# Patient Record
Sex: Male | Born: 1970 | Race: White | Hispanic: No | Marital: Single | Smoking: Current every day smoker
Health system: Southern US, Community
[De-identification: ages and names within clinical notes are randomized; demographics above are authoritative.]

---

## 2015-10-07 ENCOUNTER — Observation Stay (HOSPITAL_COMMUNITY)
Admission: EM | Admit: 2015-10-07 | Discharge: 2015-10-07 | Discharge status: Against Medical Advice | Payer: Self-pay | Attending: Internal Medicine | Admitting: Internal Medicine

## 2015-10-07 ENCOUNTER — Encounter (HOSPITAL_COMMUNITY): Payer: Self-pay | Admitting: Family Medicine

## 2015-10-07 ENCOUNTER — Emergency Department (HOSPITAL_COMMUNITY): Payer: Self-pay

## 2015-10-07 DIAGNOSIS — F17210 Nicotine dependence, cigarettes, uncomplicated: Secondary | ICD-10-CM | POA: Insufficient documentation

## 2015-10-07 DIAGNOSIS — Z7982 Long term (current) use of aspirin: Secondary | ICD-10-CM | POA: Insufficient documentation

## 2015-10-07 DIAGNOSIS — R079 Chest pain, unspecified: Secondary | ICD-10-CM | POA: Insufficient documentation

## 2015-10-07 DIAGNOSIS — R0789 Other chest pain: Secondary | ICD-10-CM

## 2015-10-07 DIAGNOSIS — F1020 Alcohol dependence, uncomplicated: Secondary | ICD-10-CM

## 2015-10-07 DIAGNOSIS — M25522 Pain in left elbow: Secondary | ICD-10-CM | POA: Insufficient documentation

## 2015-10-07 DIAGNOSIS — R55 Syncope and collapse: Principal | ICD-10-CM | POA: Insufficient documentation

## 2015-10-07 DIAGNOSIS — M25529 Pain in unspecified elbow: Secondary | ICD-10-CM

## 2015-10-07 DIAGNOSIS — Z8249 Family history of ischemic heart disease and other diseases of the circulatory system: Secondary | ICD-10-CM | POA: Insufficient documentation

## 2015-10-07 DIAGNOSIS — I10 Essential (primary) hypertension: Secondary | ICD-10-CM | POA: Insufficient documentation

## 2015-10-07 LAB — COMPREHENSIVE METABOLIC PANEL
ALK PHOS: 79 U/L (ref 38–126)
ALT: 44 U/L (ref 17–63)
AST: 29 U/L (ref 15–41)
Albumin: 3.9 g/dL (ref 3.5–5.0)
Anion gap: 11 (ref 5–15)
BILIRUBIN TOTAL: 0.6 mg/dL (ref 0.3–1.2)
BUN: 8 mg/dL (ref 6–20)
CALCIUM: 9.3 mg/dL (ref 8.9–10.3)
CO2: 26 mmol/L (ref 22–32)
CREATININE: 1.01 mg/dL (ref 0.61–1.24)
Chloride: 99 mmol/L — ABNORMAL LOW (ref 101–111)
GFR calc Af Amer: >60 mL/min (ref >60)
Glucose, Bld: 104 mg/dL — ABNORMAL HIGH (ref 65–99)
Potassium: 4.5 mmol/L (ref 3.5–5.1)
Sodium: 136 mmol/L (ref 135–145)
TOTAL PROTEIN: 6.9 g/dL (ref 6.5–8.1)

## 2015-10-07 LAB — I-STAT TROPONIN, ED: Troponin i, poc: 0 ng/mL (ref 0.00–0.08)

## 2015-10-07 LAB — CBC
HEMATOCRIT: 50.1 % (ref 39.0–52.0)
HEMOGLOBIN: 17.3 g/dL — AB (ref 13.0–17.0)
MCH: 32.1 pg (ref 26.0–34.0)
MCHC: 34.5 g/dL (ref 30.0–36.0)
MCV: 92.9 fL (ref 78.0–100.0)
Platelets: 150 10*3/uL (ref 150–400)
RBC: 5.39 MIL/uL (ref 4.22–5.81)
RDW: 14.1 % (ref 11.5–15.5)
WBC: 9.6 10*3/uL (ref 4.0–10.5)

## 2015-10-07 MED ORDER — ONDANSETRON HCL 4 MG/2ML IJ SOLN
4.0000 mg | Freq: Four times a day (QID) | INTRAMUSCULAR | Status: DC | PRN
Start: 2015-10-07 — End: 2015-10-07

## 2015-10-07 MED ORDER — ASPIRIN EC 81 MG PO TBEC: 81.0000 mg | DELAYED_RELEASE_TABLET | Freq: Every day | ORAL | Status: DC

## 2015-10-07 MED ORDER — ACETAMINOPHEN 325 MG PO TABS: 650.0000 mg | ORAL_TABLET | ORAL | Status: DC | PRN

## 2015-10-07 MED ORDER — ATORVASTATIN CALCIUM 80 MG PO TABS: 80.0000 mg | ORAL_TABLET | Freq: Every day | ORAL | Status: DC

## 2015-10-07 MED ORDER — SODIUM CHLORIDE 0.9 % IJ SOLN: 3.0000 mL | INTRAMUSCULAR | Status: DC | PRN

## 2015-10-07 MED ORDER — SODIUM CHLORIDE 0.9 % IV SOLN: 250.0000 mL | INTRAVENOUS | Status: DC | PRN

## 2015-10-07 MED ORDER — ASPIRIN 81 MG PO CHEW: 324.0000 mg | CHEWABLE_TABLET | Freq: Once | ORAL | Status: DC

## 2015-10-07 MED ORDER — NITROGLYCERIN 0.4 MG SL SUBL
0.4000 mg | SUBLINGUAL_TABLET | SUBLINGUAL | Status: DC | PRN
  Administered 2015-10-07 (×2): 0.4 mg via SUBLINGUAL
  Filled 2015-10-07: qty 1

## 2015-10-07 MED ORDER — VITAMIN B-1 100 MG PO TABS: 100.0000 mg | ORAL_TABLET | Freq: Every day | ORAL | Status: DC

## 2015-10-07 MED ORDER — SODIUM CHLORIDE 0.9 % IJ SOLN
3.0000 mL | Freq: Two times a day (BID) | INTRAMUSCULAR | Status: DC
Start: 2015-10-07 — End: 2015-10-07

## 2015-10-07 MED ORDER — HEPARIN SODIUM (PORCINE) 5000 UNIT/ML IJ SOLN: 5000.0000 [IU] | Freq: Three times a day (TID) | INTRAMUSCULAR | Status: DC

## 2015-10-07 MED ORDER — NITROGLYCERIN 0.4 MG SL SUBL: 0.4000 mg | SUBLINGUAL_TABLET | SUBLINGUAL | Status: DC | PRN

## 2015-10-07 MED ORDER — LORAZEPAM 2 MG/ML IJ SOLN: 1.0000 mg | Freq: Four times a day (QID) | INTRAMUSCULAR | Status: DC | PRN

## 2015-10-07 MED ORDER — ASPIRIN 81 MG PO CHEW
324.0000 mg | CHEWABLE_TABLET | Freq: Once | ORAL | Status: AC
  Administered 2015-10-07: 324 mg via ORAL
  Filled 2015-10-07: qty 4

## 2015-10-07 MED ORDER — ADULT MULTIVITAMIN W/MINERALS CH: 1.0000 | ORAL_TABLET | Freq: Every day | ORAL | Status: DC

## 2015-10-07 MED ORDER — FOLIC ACID 1 MG PO TABS: 1.0000 mg | ORAL_TABLET | Freq: Every day | ORAL | Status: DC

## 2015-10-07 MED ORDER — LORAZEPAM 1 MG PO TABS: 1.0000 mg | ORAL_TABLET | Freq: Four times a day (QID) | ORAL | Status: DC | PRN

## 2015-10-07 MED ORDER — THIAMINE HCL 100 MG/ML IJ SOLN: 100.0000 mg | Freq: Every day | INTRAMUSCULAR | Status: DC

## 2015-10-07 MED ORDER — METOPROLOL TARTRATE 25 MG PO TABS: 12.5000 mg | ORAL_TABLET | Freq: Two times a day (BID) | ORAL | Status: DC

## 2015-10-07 NOTE — ED Notes (Signed)
Pt here for intermittent chest pain since yesterday around 9 am. St he was washing dishes. sts cough, SOB and pain radiating into arm. sts EMS cam to the house last night and was advised not to come.

## 2015-10-07 NOTE — ED Notes (Signed)
Pt states "i refuse to stay". Paged admitting MD who spoke with nurse and  patient and discussed risks of leaving. Told to return for any reason. Pt agreeable to sign out AMA.

## 2015-10-07 NOTE — ED Notes (Signed)
Attempted report 

## 2015-10-07 NOTE — H&P (Signed)
Date: 10/07/2015               Patient Name:  Jeremy Mitchell MRN: [id number]  DOB: 09-Jun-1971 Age / Sex: 44 y.o., male   PCP: No Pcp Per Patient         Medical Service: Internal Medicine Teaching Service         Attending Physician: Dr. Burns Spain, MD    First Contact: Dr. Reubin Milan Pager: [phone]  Second Contact: Dr. Griffin Basil Pager: [phone]       After Hours (After 5p/  First Contact Pager: [phone]  weekends / holidays): Second Contact Pager: [phone]   Chief Complaint: Chest pressure and syncope  History of Present Illness: Mr. Hellums is a 44yo M with no significant PMH who presents with L-sided chest pressure that started yesterday and a syncopal episode last night. He noted that since yesterday morning he had L sided chest pressure, present at both rest and exertion, that was non-radiating, and not associated with any other symptoms. It was continuous throughout the day yesterday and yesterday afternoon while he was standing up, he passed out for <1 minute. He denied any other presyncopal symptoms, and did not have any jerking movements, tongue biting, loss of urinary of bowel continence while he was unconscious. He did not hit his head but fell on his L elbow, which is sore today. He had no post-syncope symptoms other than the continued chest pressure, after which he was given aspirin by his niece which helped the pressure. He woke up this morning and still had the same symptom which prompted him to come in to the ED. He denies diaphoresis, lightheadedness, blurred vision, chest pain, palpitations, fevers, myalgias, shortness of breath, nausea, vomiting, abdominal pain, changes in urination or bowel function, numbness or weakness. In the ED, nitro and ASA given which improved his symptoms.  He says he has been under a lot of financial and relationship stress recently, which has prompted him to smoke and drink more than usual, ~12-13 beers/day and ~2 packs/day.  He usually drinks about a 12 pack of beer/day and 1ppd smoker for 30 years.  PMH: Never seen a doctor, no significant PMH  PSH: Appendectomy  Meds: No home meds  Allergies: NKA  FH: Significant for CAD and HTN in most of his paternal relatives. Cousin got a pacemaker at 49. Brother had MI in 60s.   SH: See above  Meds: Current Facility-Administered Medications  Medication Dose Route Frequency Provider Last Rate Last Dose  . 0.9 %  sodium chloride infusion  250 mL Intravenous PRN Lora Paula, MD      . acetaminophen (TYLENOL) tablet 650 mg  650 mg Oral Q4H PRN Lora Paula, MD      . Melene Muller ON 10/08/2015] aspirin EC tablet 81 mg  81 mg Oral Daily Lora Paula, MD      . atorvastatin (LIPITOR) tablet 80 mg  80 mg Oral q1800 Lora Paula, MD      . folic acid (FOLVITE) tablet 1 mg  1 mg Oral Daily Lora Paula, MD      . heparin injection 5,000 Units  5,000 Units Subcutaneous 3 times per day Lora Paula, MD      . LORazepam (ATIVAN) tablet 1 mg  1 mg Oral Q6H PRN Lora Paula, MD       Or  . LORazepam (ATIVAN) injection 1 mg  1 mg Intravenous Q6H PRN Lora Paula, MD      .  metoprolol tartrate (LOPRESSOR) tablet 12.5 mg  12.5 mg Oral BID Lora PaulaJennifer T Krall, MD      . multivitamin with minerals tablet 1 tablet  1 tablet Oral Daily Lora PaulaJennifer T Krall, MD      . nitroGLYCERIN (NITROSTAT) SL tablet 0.4 mg  0.4 mg Sublingual Q5 Min x 3 PRN Lora PaulaJennifer T Krall, MD      . ondansetron [facility](ZOFRAN) injection 4 mg  4 mg Intravenous Q6H PRN Lora PaulaJennifer T Krall, MD      . sodium chloride 0.9 % injection 3 mL  3 mL Intravenous Q12H Lora PaulaJennifer T Krall, MD      . sodium chloride 0.9 % injection 3 mL  3 mL Intravenous PRN Lora PaulaJennifer T Krall, MD      . thiamine (VITAMIN B-1) tablet 100 mg  100 mg Oral Daily Lora PaulaJennifer T Krall, MD       Or  . thiamine (B-1) injection 100 mg  100 mg Intravenous Daily Lora PaulaJennifer T Krall, MD       No current outpatient prescriptions on file.     Allergies: Allergies as of 10/07/2015  . (No Known Allergies)   History reviewed. No pertinent past medical history. History reviewed. No pertinent past surgical history. History reviewed. No pertinent family history. Social History   Social History  . Marital Status: Single    Spouse Name: N/A  . Number of Children: N/A  . Years of Education: N/A   Occupational History  . Not on file.   Social History Main Topics  . Smoking status: Current Every Day Smoker -- 1.00 packs/day    Types: Cigarettes  . Smokeless tobacco: Not on file  . Alcohol Use: Yes     Comment: every day  . Drug Use: No  . Sexual Activity: Not on file   Other Topics Concern  . Not on file   Social History Narrative  . No narrative on file    Review of Systems: Pertinent items noted in HPI and remainder of comprehensive ROS otherwise negative.  Physical Exam: Blood pressure 145/96, pulse 78, temperature 98.5 F (36.9 C), resp. rate 17, weight 238 lb (107.956 kg), SpO2 97 %. Gen: Well-appearing, alert and oriented to person, place, and time HEENT: Oropharynx clear without erythema or exudate.  Neck: No cervical LAD, no thyromegaly or nodules, no JVD noted. CV: Normal rate, regular rhythm, no murmurs, rubs, or gallops Pulmonary: Normal effort, CTA bilaterally, no wheezing, rales, or rhonchi Abdominal: Soft, non-tender, non-distended, without rebound, guarding, or masses Extremities: Distal pulses 2+ in upper and lower extremities bilaterally, no tenderness, erythema or edema Skin: No atypical appearing moles. No rashes  Lab results: Basic Metabolic Panel:  Recent Labs  16/07/9611/10/16 1114  NA 136  K 4.5  CL 99*  CO2 26  GLUCOSE 104*  BUN 8  CREATININE 1.01  CALCIUM 9.3   Liver Function Tests:  Recent Labs  10/07/15 1114  AST 29  ALT 44  ALKPHOS 79  BILITOT 0.6  PROT 6.9  ALBUMIN 3.9   No results for input(s): LIPASE, AMYLASE in the last 72 hours. No results for input(s):  AMMONIA in the last 72 hours. CBC:  Recent Labs  10/07/15 1114  WBC 9.6  HGB 17.3*  HCT 50.1  MCV 92.9  PLT 150   Imaging results:  Dg Elbow Complete Left  10/07/2015  CLINICAL DATA:  Syncopal episode, fall, elbow pain EXAM: LEFT ELBOW - COMPLETE 3+ VIEW COMPARISON:  None. FINDINGS: There is no evidence of fracture,  dislocation, or joint effusion. There is no evidence of arthropathy or other focal bone abnormality. Soft tissues are unremarkable. IMPRESSION: No acute osseous finding. Electronically Signed   By: Judie Petit.  Shick M.D.   On: 10/07/2015 12:28   Ct Head Wo Contrast  10/07/2015  CLINICAL DATA:  Chest pain, syncopal episode EXAM: CT HEAD WITHOUT CONTRAST TECHNIQUE: Contiguous axial images were obtained from the base of the skull through the vertex without contrast. COMPARISON:  None FINDINGS: Normal appearance of the intracranial structures. No evidence for acute hemorrhage, mass lesion, midline shift, hydrocephalus or large infarct. No acute bony abnormality. Mastoids are clear. Scattered frontal, ethmoid, and sphenoid mucosal thickening appearing chronic. IMPRESSION: No acute intracranial abnormality. Electronically Signed   By: Judie Petit.  Shick M.D.   On: 10/07/2015 13:25   Dg Chest Port 1 View  10/07/2015  CLINICAL DATA:  Chest pain.  Chest pain started 9 o'clock yesterday. EXAM: PORTABLE CHEST 1 VIEW COMPARISON:  None. FINDINGS: Normal mediastinum and cardiac silhouette. Normal pulmonary vasculature. No evidence of effusion, infiltrate, or pneumothorax. No acute bony abnormality. IMPRESSION: Normal chest radiograph. Electronically Signed   By: Genevive Bi M.D.   On: 10/07/2015 11:44    Other results: EKG: NSR, T-wave inversions in V2-4, no prior EKG for comparison  Assessment & Plan by Problem: Active Problems:   Chest pain 1. Chest pressure with syncope - Likely cardiac vs stress/EtOH induced, given strong FH, L-sided pressure, heavy smoker/drinker. EKG shows TWI in V2-4.  Troponins negative. Will admit for ACS r/o. Explained necessity for patient to stay for further testing, as well as the potential medical risks of leaving but patient is adamant on leaving AMA.  -Echo -F/u CT head -Trend troponins -Telemetry -Counseled on smoking and EtOH cessation -Orthostatics -Start ASA 81, lipitor  QD, metoprolol 12.5 BID  2. HTN - previously not seen by a MD, elevated to 153/98 max today -Metoprolol as above  3. Elbow pain - likely contusion 2/2 syncopal event -Elbow Xray - negative for fracture  4. Alcohol use disorder  -CIWA protocol   Dispo: Disposition is deferred at this time, awaiting improvement of current medical problems. Anticipated discharge in approximately 1 day(s).   The patient does not have a current PCP (No Pcp Per Patient) and does need an [facility] hospital follow-up appointment after discharge.  The patient does not have transportation limitations that hinder transportation to clinic appointments.  Signed: Darrick Huntsman, MD 10/07/2015, 1:47 PM

## 2015-10-07 NOTE — ED Provider Notes (Signed)
CSN: [id number]     Arrival date & time 10/07/15  1019 History   First MD Initiated Contact with Patient 10/07/15 1035     Chief Complaint  Patient presents with  . Chest Pain  . Arm Pain     (Consider location/radiation/quality/duration/timing/severity/associated sxs/prior Treatment) HPI Comments: Patient is a 44 year old male with no significant past medical history presenting to the ED with chief complaint of chest pain and elbow pain. Patient reports having left-sided pressure-like chest pain since yesterday 9 AM. States the chest pain is worse when he coughs. States he was doing his dishes when the pain started (7/10 intensity) and since yesterday the pain has been intermittent, associated with slight SOB, and he is not sure how long each episode lasts. Denies having any fevers, chills, or lightheadedness. Patient reports being under a lot of stress after a recent separation. States he had similar CP several months ago. At present, states the chest pain is 2/10 in intensity.  He also reports having an episode of loss of consciousness last night. Patient's sister states he was standing and all of a sudden had a blank state, then fell face forward. States when the EMS came, patient was experiencing weakness in his legs and was confused. Patient states he woke up this morning with a headache (frontal) and took 4 Ibuprofens. He is also complaining of pain in his L elbow. Denies having any other pain. States he drinks 12-13 beers a day. Denies having any nausea, vomiting, abdominal pain, hematochezia or melena.   Patient is a 44 y.o. male presenting with chest pain and arm pain.  Chest Pain Associated symptoms: cough and shortness of breath   Associated symptoms: no abdominal pain, no back pain, no fever, no nausea, no numbness, not vomiting and no weakness   Arm Pain Associated symptoms include chest pain, congestion and coughing. Pertinent negatives include no abdominal pain, chills, fever,  nausea, neck pain, numbness, vomiting or weakness.    History reviewed. No pertinent past medical history. History reviewed. No pertinent past surgical history. History reviewed. No pertinent family history. Social History  Substance Use Topics  . Smoking status: Current Every Day Smoker -- 1.00 packs/day    Types: Cigarettes  . Smokeless tobacco: None  . Alcohol Use: Yes     Comment: every day    Review of Systems  Constitutional: Negative for fever and chills.  HENT: Positive for congestion.   Eyes: Negative for pain and discharge.  Respiratory: Positive for cough and shortness of breath. Negative for wheezing.   Cardiovascular: Positive for chest pain. Negative for leg swelling.  Gastrointestinal: Negative for nausea, vomiting, abdominal pain, diarrhea, constipation and blood in stool.  Genitourinary: Negative for difficulty urinating.  Musculoskeletal: Negative for back pain and neck pain.       L elbow pain  Neurological: Negative for facial asymmetry, speech difficulty, weakness and numbness.       Syncope       Allergies  Review of patient's allergies indicates no known allergies.  Home Medications   Prior to Admission medications   Not on File   BP 155/99 mmHg  Pulse 92  Temp(Src) 98.5 F (36.9 C)  Resp 18  Wt 107.956 kg  SpO2 98% Physical Exam  Constitutional: He is oriented to person, place, and time. He appears well-developed and well-nourished. No distress.  HENT:  Head: Normocephalic and atraumatic.  Mouth/Throat: Oropharynx is clear and moist.  Eyes: EOM are normal. Pupils are equal, round, and reactive  to light.  Neck: Normal range of motion. Neck supple. No tracheal deviation present.  Cardiovascular: Normal rate, regular rhythm and intact distal pulses.  Exam reveals no gallop and no friction rub.   No murmur heard. Pulmonary/Chest: Effort normal. No respiratory distress. He has no wheezes.  Mild bibasilar crackles   Abdominal: Soft. Bowel  sounds are normal. He exhibits no distension. There is no tenderness.  Musculoskeletal: He exhibits no edema.  L elbow: slightly tender to palpation. Normal ROM. Skin abrasion noted.   L shoulder: non-TTP and normal ROM.  Cervical, thoracic, and lumbar spine: Nontender to palpation. Normal range of motion of the neck.   Neurological: He is alert and oriented to person, place, and time. No cranial nerve deficit.  Strength and sensation grossly intact in bilateral upper and lower extremities.  Skin: Skin is warm and dry.    ED Course  Procedures (including critical care time) Labs Review Labs Reviewed  CBC  COMPREHENSIVE METABOLIC PANEL  I-STAT TROPOININ, ED    Imaging Review No results found. I have personally reviewed and evaluated these images and lab results as part of my medical decision-making.   EKG Interpretation None      MDM   Final diagnoses:  None  Chest pain and syncope  Patient is presenting with a 1 day history of intermittent chest pain, initially 7 out of 10 in intensity and now 2 out of 10 at rest. Blood pressure stable 138/92 and pulse 100. He is satting 96% on room air. I-STAT troponin negative. EKG showing T-wave abnormality in the lateral leads, no previous EKG to compare. CXR did not show any acute abnormality. He has been given aspirin 325 mg and sublingual nitroglycerin 0.4 mg prn has been ordered. Patient also reports waking up with a headache status post fall last night. Neuro exam normal. Patient is also complaining of left elbow pain since the fall. X-ray of the L elbow is normal.  -CT head w/o contrast pending to r/o intracranial bleed after fall  -Spoke to medicine team - patient will be admitted for further workup of this chest pain and syncope.     John GiovanniVasundhra Vin Yonke, MD 10/07/15 1240  John GiovanniVasundhra Elvis Laufer, MD 10/07/15 1248  Margarita Grizzleanielle Ray, MD 10/07/15 [phone]

## 2015-10-28 NOTE — Discharge Summary (Signed)
Name: Jeremy Mitchell MRN: [id number] DOB: [birth year] 44 y.o. PCP: No Pcp Per Patient  Date of Admission: 10/07/2015 10:21 AM Date of Discharge: 10/28/2015 Attending Physician: No att. providers found  Discharge Diagnosis: 1. Chest pain  Active Problems:   Chest pain  Discharge Medications:   Medication List    ASK your doctor about these medications        aspirin 81 MG tablet  Take 81 mg by mouth.     ibuprofen 100 MG tablet  Commonly known as:  ADVIL,MOTRIN  Take 400 mg by mouth every 6 (six) hours as needed for pain or fever.        Disposition and follow-up:   [name]  At the hospital follow up visit please address:  1.  Recurrence of chest pain sx, smoking and EtOH cessation  2.  Labs / imaging needed at time of follow-up: None  3.  Pending labs/ test needing follow-up: None  Procedures Performed:  Dg Elbow Complete Left  10/07/2015  CLINICAL DATA:  Syncopal episode, fall, elbow pain EXAM: LEFT ELBOW - COMPLETE 3+ VIEW COMPARISON:  None. FINDINGS: There is no evidence of fracture, dislocation, or joint effusion. There is no evidence of arthropathy or other focal bone abnormality. Soft tissues are unremarkable. IMPRESSION: No acute osseous finding. Electronically Signed   By: Judie PetitM.  Shick M.D.   On: 10/07/2015 12:28   Ct Head Wo Contrast  10/07/2015  CLINICAL DATA:  Chest pain, syncopal episode EXAM: CT HEAD WITHOUT CONTRAST TECHNIQUE: Contiguous axial images were obtained from the base of the skull through the vertex without contrast. COMPARISON:  None FINDINGS: Normal appearance of the intracranial structures. No evidence for acute hemorrhage, mass lesion, midline shift, hydrocephalus or large infarct. No acute bony abnormality. Mastoids are clear. Scattered frontal, ethmoid, and sphenoid mucosal thickening appearing chronic. IMPRESSION: No acute intracranial abnormality.  Electronically Signed   By: Judie PetitM.  Shick M.D.   On: 10/07/2015 13:25   Dg Chest Port 1 View  10/07/2015  CLINICAL DATA:  Chest pain.  Chest pain started 9 o'clock yesterday. EXAM: PORTABLE CHEST 1 VIEW COMPARISON:  None. FINDINGS: Normal mediastinum and cardiac silhouette. Normal pulmonary vasculature. No evidence of effusion, infiltrate, or pneumothorax. No acute bony abnormality. IMPRESSION: Normal chest radiograph. Electronically Signed   By: Genevive BiStewart  Edmunds M.D.   On: 10/07/2015 11:44   Admission HPI: Mr. Jeremy Mitchell is a 44yo M with no significant PMH who presents with L-sided chest pressure that started yesterday and a syncopal episode last night. He noted that since yesterday morning he had L sided chest pressure, present at both rest and exertion, that was non-radiating, and not associated with any other symptoms. It was continuous throughout the day yesterday and yesterday afternoon while he was standing up, he passed out for <1 minute. He denied any other presyncopal symptoms, and did not have any jerking movements, tongue biting, loss of urinary of bowel continence while he was unconscious. He did not hit his head but fell on his L elbow, which is sore today. He had no post-syncope symptoms other than the continued chest pressure, after which he was given aspirin by his niece which helped the pressure. He woke up this morning and still had the same symptom which prompted him to come in to the ED. He denies diaphoresis, lightheadedness, blurred vision, chest pain, palpitations, fevers, myalgias, shortness of breath, nausea, vomiting, abdominal pain, changes in urination or bowel function,  numbness or weakness. In the ED, nitro and ASA given which improved his symptoms.  He says he has been under a lot of financial and relationship stress recently, which has prompted him to smoke and drink more than usual, ~12-13 beers/day and ~2 packs/day. He usually drinks about a 12 pack of beer/day and 1ppd smoker  for 30 years.  Hospital Course by problem list: Active Problems:   Chest pain   1. Chest pain - Thought to be cardiac vs stress/EtOH induced, given strong FH, L-sided pressure, heavy smoker/drinker. EKG showed TWI in V2-4. Troponins negative. Our team came down to the ED to admit him for ACS r/o. We explained the necessity for patient to stay for further testing, as well as the potential medical risks of leaving but patient was adamant on leaving AMA and left before our attending could see him.   Discharge Vitals:   BP 137/94 mmHg  Pulse 98  Temp(Src) 98.5 F (36.9 C)  Resp 17  Wt 238 lb (107.956 kg)  SpO2 93%  Discharge Labs:  No results found for this or any previous visit (from the past 24 hour(s)).  Signed: Darrick Huntsman, MD 10/28/2015, 9:20 PM

## 2017-05-21 IMAGING — CR DG CHEST 1V PORT
1 series · 1 of 1 positions shown · non-contrast
Comparison: None.

CLINICAL DATA: Chest pain.  Chest pain started 9 o'clock yesterday.

EXAM:
PORTABLE CHEST 1 VIEW

[AP]
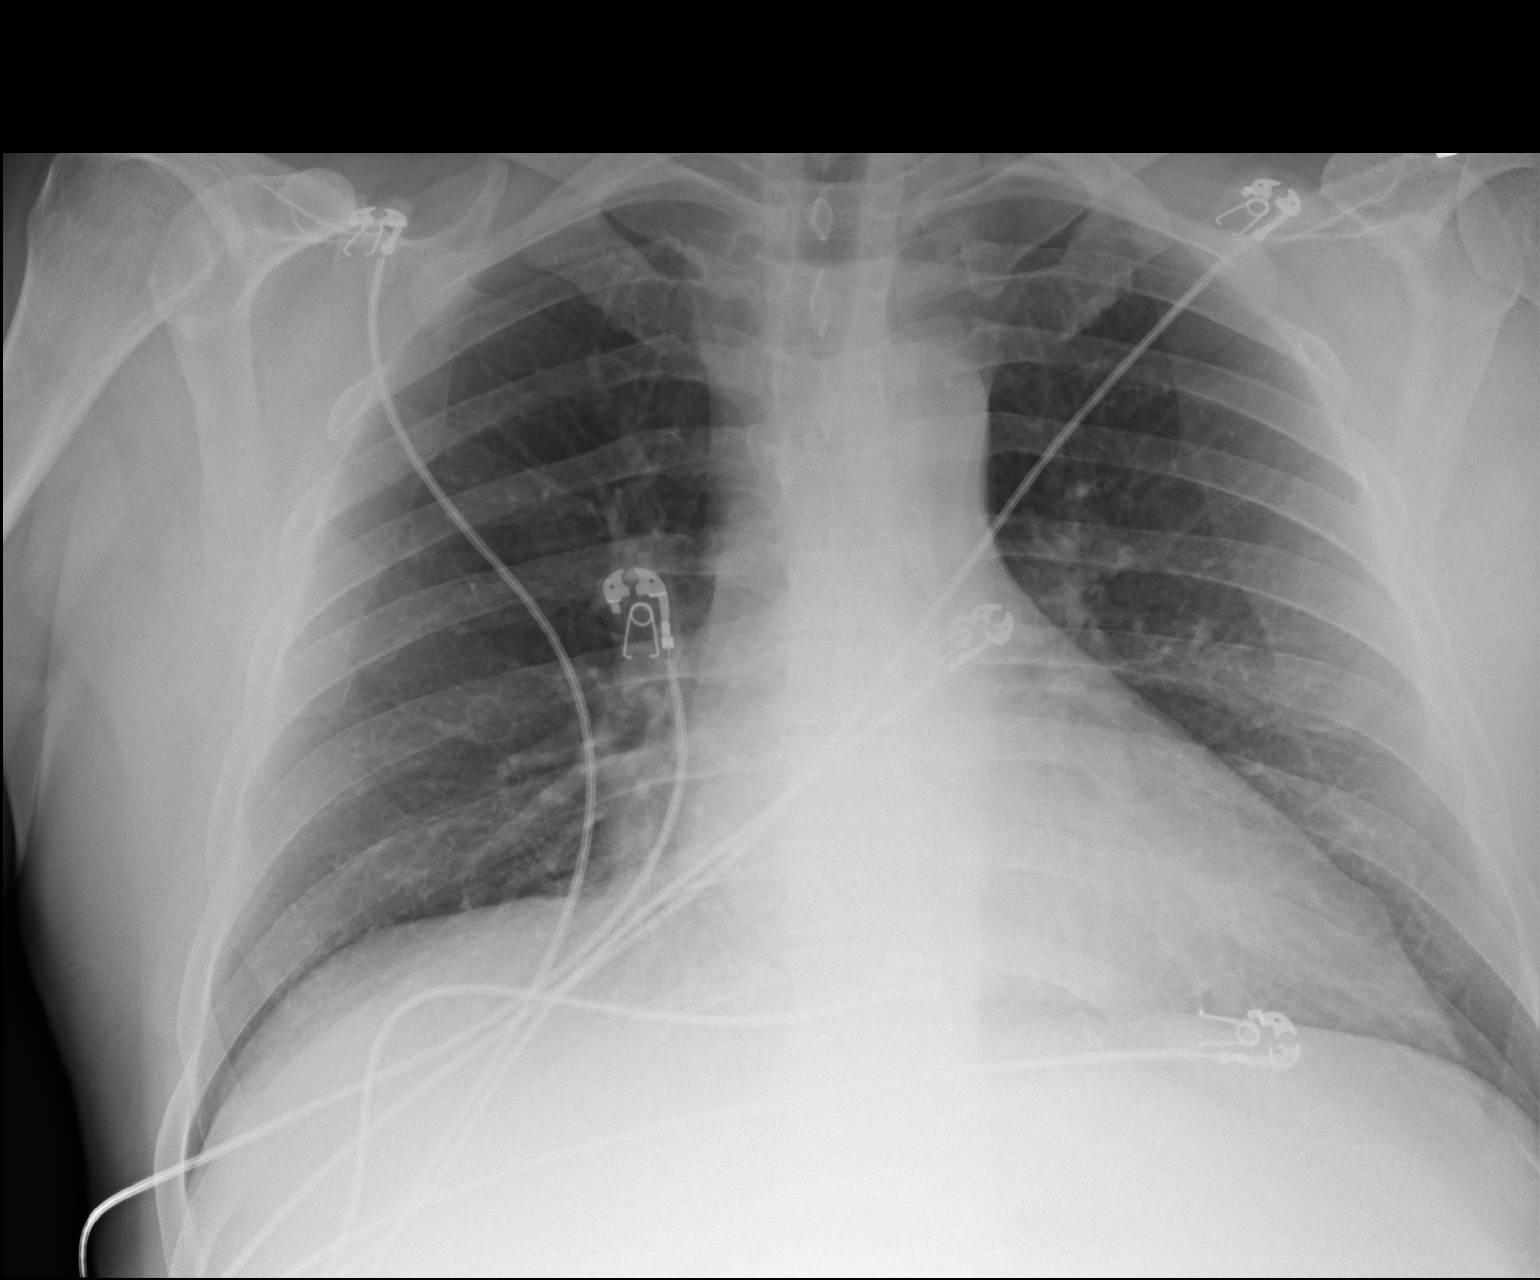

[1 of 1 positions shown; findings below may reference images not displayed]

FINDINGS: Normal mediastinum and cardiac silhouette. Normal pulmonary
vasculature. No evidence of effusion, infiltrate, or pneumothorax.
No acute bony abnormality.
IMPRESSION: Normal chest radiograph.
